# Patient Record
Sex: Female | Born: 1975 | Race: White | Hispanic: No | Marital: Married | Smoking: Never smoker
Health system: Southern US, Community
[De-identification: ages and names within clinical notes are randomized; demographics above are authoritative.]

## PROBLEM LIST (undated history)

## (undated) DIAGNOSIS — M359 Systemic involvement of connective tissue, unspecified: Secondary | ICD-10-CM

## (undated) DIAGNOSIS — IMO0002 Reserved for concepts with insufficient information to code with codable children: Secondary | ICD-10-CM

## (undated) HISTORY — DX: Reserved for concepts with insufficient information to code with codable children: IMO0002

## (undated) HISTORY — PX: BREAST BIOPSY: SHX20

## (undated) HISTORY — DX: Systemic involvement of connective tissue, unspecified: M35.9

## (undated) HISTORY — PX: WISDOM TOOTH EXTRACTION: SHX21

## (undated) HISTORY — PX: TONSILLECTOMY: SUR1361

---

## 2005-05-01 ENCOUNTER — Ambulatory Visit: Admission: RE | Admit: 2005-05-01 | Discharge: 2005-05-01 | Payer: Self-pay | Admitting: Obstetrics and Gynecology

## 2009-04-28 ENCOUNTER — Inpatient Hospital Stay (HOSPITAL_COMMUNITY): Admission: AD | Admit: 2009-04-28 | Discharge: 2009-04-29 | Payer: Self-pay | Admitting: Obstetrics and Gynecology

## 2010-11-22 LAB — CBC
HCT: 33.4 % — ABNORMAL LOW (ref 36.0–46.0)
MCV: 98.7 fL (ref 78.0–100.0)
Platelets: 214 10*3/uL (ref 150–400)
RDW: 12.4 % (ref 11.5–15.5)

## 2012-02-10 ENCOUNTER — Encounter: Payer: Self-pay | Admitting: Obstetrics and Gynecology

## 2012-02-16 ENCOUNTER — Ambulatory Visit: Payer: Self-pay | Admitting: Obstetrics and Gynecology

## 2012-04-28 ENCOUNTER — Ambulatory Visit: Payer: Self-pay | Admitting: Obstetrics and Gynecology

## 2012-05-07 ENCOUNTER — Encounter: Payer: Self-pay | Admitting: Obstetrics and Gynecology

## 2012-05-07 ENCOUNTER — Ambulatory Visit (INDEPENDENT_AMBULATORY_CARE_PROVIDER_SITE_OTHER): Payer: BC Managed Care – PPO | Admitting: Obstetrics and Gynecology

## 2012-05-07 VITALS — Ht 67.0 in | Wt 158.0 lb

## 2012-05-07 DIAGNOSIS — Z124 Encounter for screening for malignant neoplasm of cervix: Secondary | ICD-10-CM

## 2012-05-07 DIAGNOSIS — Z8342 Family history of familial hypercholesterolemia: Secondary | ICD-10-CM

## 2012-05-07 DIAGNOSIS — R5383 Other fatigue: Secondary | ICD-10-CM

## 2012-05-07 DIAGNOSIS — R5381 Other malaise: Secondary | ICD-10-CM

## 2012-05-07 DIAGNOSIS — Z8349 Family history of other endocrine, nutritional and metabolic diseases: Secondary | ICD-10-CM

## 2012-05-07 NOTE — Progress Notes (Signed)
Subjective:    Miranda Meyer is a 36 y.o. female, G3P3003, who presents for an annual exam.   Patient reports:  Doing well, no issues.  Husband plans vasectomy "sometime soon".  Patient on cycle today.   Children doing well.    History   Social History  . Marital Status: Married    Spouse Name: N/A    Number of Children: N/A  . Years of Education: N/A   Social History Main Topics  . Smoking status: Never Smoker   . Smokeless tobacco: Never Used  . Alcohol Use: No  . Drug Use: No  . Sexually Active: Yes    Birth Control/ Protection: Condom   Other Topics Concern  . None   Social History Narrative  . None    Menstrual cycle:   LMP: Patient's last menstrual period was 05/03/2012.           Cycle: WNL  The following portions of the patient's history were reviewed and updated as appropriate: allergies, current medications, past family history, past medical history, past social history, past surgical history and problem list.  Review of Systems Pertinent items are noted in HPI. Breast:Negative for breast lump,nipple discharge or nipple retraction Gastrointestinal: Negative for abdominal pain, change in bowel habits or rectal bleeding Urinary:negative   Objective:    Ht 5\' 7"  (1.702 m)  Wt 158 lb (71.668 kg)  BMI 24.75 kg/m2  LMP 05/03/2012    Weight:  Wt Readings from Last 1 Encounters:  05/07/12 158 lb (71.668 kg)          BMI: Body mass index is 24.75 kg/(m^2).  General Appearance: Alert, appropriate appearance for age. No acute distress HEENT: Grossly normal Neck / Thyroid: Supple, no masses, nodes or enlargement Lungs: clear to auscultation bilaterally Back: No CVA tenderness Breast Exam: No dimpling, nipple retraction or discharge. No masses or nodes. and No masses or nodes.No dimpling, nipple retraction or discharge. Cardiovascular: Regular rate and rhythm. S1, S2, no murmur Gastrointestinal: Soft, non-tender, no masses or organomegaly Pelvic Exam: Vulva and  vagina appear normal. Bimanual exam reveals normal uterus and adnexa. Rectovaginal: normal rectal, no masses Lymphatic Exam: Non-palpable nodes in neck, clavicular, axillary, or inguinal regions Skin: no rash or abnormalities Neurologic: Normal gait and speech, no tremor  Psychiatric: Alert and oriented, appropriate affect.   Wet Prep: NA Urinalysis:not applicable UPT: Not done   Assessment:    Normal gyn exam    Plan:    Mammogram: Age 53 Pap:  Done today STD screening: declined Contraception:condoms Requests fasting lipid profile due to family hx hypercholesterinemia--will add vit D due to some fatigue.  Vit D and fasting lipids as lab only at patient convenience.   Nyra Capes, [place]

## 2012-05-07 NOTE — Progress Notes (Signed)
The patient reports: no complaints  Contraception:condoms  Last mammogram: not applicable  Last pap: approximate date 2012 and was normal  GC/Chlamydia cultures offered: declined HIV/RPR/HbsAg offered:  declined HSV 1 and 2 glycoprotein offered: declined  Menstrual cycle regular and monthly: Yes Menstrual flow normal: Yes  Urinary symptoms: none Normal bowel movements: Yes Reports abuse at home: No:

## 2012-05-10 LAB — PAP IG W/ RFLX HPV ASCU

## 2012-05-21 ENCOUNTER — Other Ambulatory Visit: Payer: BC Managed Care – PPO

## 2012-05-21 DIAGNOSIS — Z8342 Family history of familial hypercholesterolemia: Secondary | ICD-10-CM

## 2012-05-21 DIAGNOSIS — R5383 Other fatigue: Secondary | ICD-10-CM

## 2012-05-21 LAB — LIPID PANEL
Total CHOL/HDL Ratio: 2 Ratio
VLDL: 6 mg/dL (ref 0–40)

## 2012-05-22 LAB — VITAMIN D 25 HYDROXY (VIT D DEFICIENCY, FRACTURES): Vit D, 25-Hydroxy: 37 ng/mL (ref 30–89)

## 2012-05-25 ENCOUNTER — Telehealth: Payer: Self-pay

## 2012-05-25 NOTE — Telephone Encounter (Signed)
Message copied by Larwance Rote on Tue May 25, 2012 11:09 AM ------      Message from: Cornelius Moras      Created: Sat May 22, 2012  8:12 AM       Please call patient and review normal labs.      Thanks!~      VL

## 2012-05-25 NOTE — Telephone Encounter (Signed)
LM for pt that lab results were normal.  Pt to call if questions.  ld

## 2014-06-19 ENCOUNTER — Encounter: Payer: Self-pay | Admitting: Obstetrics and Gynecology

## 2017-05-20 ENCOUNTER — Telehealth: Payer: Self-pay | Admitting: Vascular Surgery

## 2017-05-20 NOTE — Telephone Encounter (Signed)
-----   Message from Micah Flesher, RN sent at 05/18/2017  5:34 PM EDT ----- Regarding: scheduling Please schedule new VV evaluation and bilateral venous reflux study (unless seeing JDL).  Miranda Meyer has bilateral leg pain "heavy feeling" and VV R>L which have become progressively worse after 3 pregnancies.

## 2017-05-20 NOTE — Telephone Encounter (Signed)
Called pt and LVM for her regarding scheduling a VV new pt evaluation. I did mention that I did schedule her for the first available appt which is 06/22/17 at 1pm w/ JDL. AWT

## 2017-06-22 ENCOUNTER — Encounter: Payer: Self-pay | Admitting: Vascular Surgery

## 2017-06-22 ENCOUNTER — Ambulatory Visit (INDEPENDENT_AMBULATORY_CARE_PROVIDER_SITE_OTHER): Payer: BLUE CROSS/BLUE SHIELD | Admitting: Vascular Surgery

## 2017-06-22 VITALS — BP 110/71 | HR 81 | Temp 98.1°F | Resp 16 | Ht 67.0 in | Wt 150.0 lb

## 2017-06-22 DIAGNOSIS — I83893 Varicose veins of bilateral lower extremities with other complications: Secondary | ICD-10-CM | POA: Diagnosis not present

## 2017-06-22 DIAGNOSIS — I868 Varicose veins of other specified sites: Secondary | ICD-10-CM

## 2017-06-22 NOTE — Progress Notes (Signed)
Subjective:     Patient ID: Miranda RocherStacy Meyer, female   DOB: 08-Nov-1975, 41 y.o.   MRN: [id number]  HPI This 41 year old female was referred for evaluation of bilateral painful varicose veins. This patient developed varicose veins in the right leg after her first pregnancy. She has had 3 pregnancies the most recent of which was 8 years ago and since then varicosities have worsened significantly in the right leg and also in the left. She has aching throbbing burning discomfort in the calf and thigh area which worsens as the day progresses and does develop some swelling particularly on the right side. She has no history of DVT thrombophlebitis stasis ulcers or bleeding. She does not elastic compression stockings. Her symptoms continue to worsen and she does desire treatment.  Past Medical History:  Diagnosis Date  . Connective tissue disorder (HCC)   . Perineal laceration    poor healing     Social History   Tobacco Use  . Smoking status: Never Smoker  . Smokeless tobacco: Never Used  Substance Use Topics  . Alcohol use: No    Family History  Problem Relation Age of Onset  . Cancer Father        throat cancer   . Mental retardation Sister   . Hypertension Brother     No Known Allergies  No current outpatient medications on file.  Vitals:   06/22/17 1311  BP: 110/71  Pulse: 81  Resp: 16  Temp: 98.1 F (36.7 C)  SpO2: 99%  Weight: 150 lb (68 kg)  Height: 5\' 7"  (1.702 m)    Body mass index is 23.49 kg/m.           Review of Systems Denies chest pain, dyspnea on exertion, PND, orthopnea. Very healthy middle-aged female    Objective:   Physical Exam BP 110/71 (BP Location: Left Arm, Patient Position: Sitting, Cuff Size: Normal)   Pulse 81   Temp 98.1 F (36.7 C)   Resp 16   Ht 5\' 7"  (1.702 m)   Wt 150 lb (68 kg)   SpO2 99%   BMI 23.49 kg/m     Gen.-alert and oriented x3 in no apparent distress HEENT normal for age Lungs no rhonchi or  wheezing Cardiovascular regular rhythm no murmurs carotid pulses 3+ palpable no bruits audible Abdomen soft nontender no palpable masses Musculoskeletal free of  major deformities Skin clear -no rashes Neurologic normal Lower extremities 3+ femoral and dorsalis pedis pulses palpable bilaterally with no edema on the left 1+ edema on the right Extensive bulging varicosities beginning in the distal file the right extending below the knee and across the pretibial region down to the dorsum of the foot Left leg has bulging varicosities in the distal medial thigh medial calf posteriorly No hyperpigmentation or ulceration noted bilaterally  Today I performed bilateral ultrasound sono site exam and visualize both great saphenous veins. Each great saphenous vein is very large in caliber right greater than left with obvious gross reflux throughout supplying these painful varicosities       Assessment:     #1 painful varicosities bilaterally due to gross reflux bilateral great saphenous veins causing symptoms which are affecting patient's daily living-CEAP3    Plan:         #1 long leg elastic compression stockings 20-30 mm gradient #2 elevate legs as much as possible #3 ibuprofen daily on a regular basis for pain #4 return in 3 months-she will have formal venous reflux exam bilaterally upon return  in all make formal recommendation Its likely that she will need laser ablation right great saphenous vein plus greater than 20 stab phlebectomy followed by #2 laser ablation left great saphenous vein +10-20 stab phlebectomy of painful varicosities Return in 3 months

## 2017-06-23 NOTE — Addendum Note (Signed)
Addended by: Burton ApleyPETTY, Almas Rake A on: 06/23/2017 10:07 AM   Modules accepted: Orders

## 2017-09-22 ENCOUNTER — Encounter (HOSPITAL_COMMUNITY): Payer: BLUE CROSS/BLUE SHIELD

## 2017-09-22 ENCOUNTER — Ambulatory Visit: Payer: BLUE CROSS/BLUE SHIELD | Admitting: Vascular Surgery

## 2018-08-04 ENCOUNTER — Other Ambulatory Visit: Payer: Self-pay | Admitting: Obstetrics and Gynecology

## 2018-08-04 DIAGNOSIS — R921 Mammographic calcification found on diagnostic imaging of breast: Secondary | ICD-10-CM

## 2018-08-04 DIAGNOSIS — N6320 Unspecified lump in the left breast, unspecified quadrant: Secondary | ICD-10-CM

## 2018-08-10 ENCOUNTER — Other Ambulatory Visit: Payer: Self-pay | Admitting: Obstetrics and Gynecology

## 2018-08-10 ENCOUNTER — Ambulatory Visit
Admission: RE | Admit: 2018-08-10 | Discharge: 2018-08-10 | Disposition: A | Discharge status: Home / Self Care | Payer: BLUE CROSS/BLUE SHIELD | Source: Ambulatory Visit | Attending: Obstetrics and Gynecology | Admitting: Obstetrics and Gynecology

## 2018-08-10 ENCOUNTER — Ambulatory Visit
Admission: RE | Admit: 2018-08-10 | Discharge: 2018-08-10 | Disposition: A | Discharge status: Home / Self Care | Payer: 59 | Source: Ambulatory Visit | Attending: Obstetrics and Gynecology | Admitting: Obstetrics and Gynecology

## 2018-08-10 DIAGNOSIS — N6320 Unspecified lump in the left breast, unspecified quadrant: Secondary | ICD-10-CM

## 2018-08-10 DIAGNOSIS — R921 Mammographic calcification found on diagnostic imaging of breast: Secondary | ICD-10-CM

## 2018-08-10 DIAGNOSIS — N6310 Unspecified lump in the right breast, unspecified quadrant: Secondary | ICD-10-CM

## 2018-08-23 ENCOUNTER — Ambulatory Visit
Admission: RE | Admit: 2018-08-23 | Discharge: 2018-08-23 | Disposition: A | Discharge status: Home / Self Care | Payer: 59 | Source: Ambulatory Visit | Attending: Obstetrics and Gynecology | Admitting: Obstetrics and Gynecology

## 2018-08-23 DIAGNOSIS — N6310 Unspecified lump in the right breast, unspecified quadrant: Secondary | ICD-10-CM

## 2018-11-12 ENCOUNTER — Emergency Department (HOSPITAL_COMMUNITY): Payer: 59

## 2018-11-12 ENCOUNTER — Other Ambulatory Visit: Payer: Self-pay

## 2018-11-12 ENCOUNTER — Emergency Department (HOSPITAL_COMMUNITY)
Admission: EM | Admit: 2018-11-12 | Discharge: 2018-11-12 | Disposition: A | Discharge status: Home / Self Care | Payer: 59 | Attending: Emergency Medicine | Admitting: Emergency Medicine

## 2018-11-12 ENCOUNTER — Encounter (HOSPITAL_COMMUNITY): Payer: Self-pay

## 2018-11-12 DIAGNOSIS — Y998 Other external cause status: Secondary | ICD-10-CM | POA: Insufficient documentation

## 2018-11-12 DIAGNOSIS — S0990XA Unspecified injury of head, initial encounter: Secondary | ICD-10-CM | POA: Insufficient documentation

## 2018-11-12 DIAGNOSIS — Y9355 Activity, bike riding: Secondary | ICD-10-CM | POA: Diagnosis not present

## 2018-11-12 DIAGNOSIS — S20211A Contusion of right front wall of thorax, initial encounter: Secondary | ICD-10-CM | POA: Diagnosis not present

## 2018-11-12 DIAGNOSIS — M542 Cervicalgia: Secondary | ICD-10-CM | POA: Diagnosis not present

## 2018-11-12 DIAGNOSIS — Y929 Unspecified place or not applicable: Secondary | ICD-10-CM | POA: Diagnosis not present

## 2018-11-12 DIAGNOSIS — Z79899 Other long term (current) drug therapy: Secondary | ICD-10-CM | POA: Diagnosis not present

## 2018-11-12 MED ORDER — HYDROMORPHONE HCL 1 MG/ML IJ SOLN
0.5000 mg | Freq: Once | INTRAMUSCULAR | Status: DC
  Filled 2018-11-12: qty 1

## 2018-11-12 MED ORDER — ACETAMINOPHEN 500 MG PO TABS
1000.0000 mg | ORAL_TABLET | Freq: Once | ORAL | Status: AC
  Administered 2018-11-12: 1000 mg via ORAL
  Filled 2018-11-12: qty 2

## 2018-11-12 NOTE — ED Notes (Signed)
Patient transported to CT 

## 2018-11-12 NOTE — Discharge Instructions (Addendum)
Xrays of head, neck, chest showed no acute findings.  Will be sore for several days.  Tylenol and/or ibuprofen for pain.
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
## 2019-04-13 ENCOUNTER — Telehealth (HOSPITAL_COMMUNITY): Payer: Self-pay | Admitting: Rehabilitation

## 2019-04-13 NOTE — Telephone Encounter (Signed)

## 2019-04-14 ENCOUNTER — Other Ambulatory Visit: Payer: Self-pay

## 2019-04-14 ENCOUNTER — Ambulatory Visit (INDEPENDENT_AMBULATORY_CARE_PROVIDER_SITE_OTHER): Payer: 59 | Admitting: Vascular Surgery

## 2019-04-14 ENCOUNTER — Encounter: Payer: Self-pay | Admitting: Vascular Surgery

## 2019-04-14 VITALS — BP 109/75 | HR 71 | Temp 98.4°F | Resp 16 | Ht 67.0 in | Wt 154.0 lb

## 2019-04-14 DIAGNOSIS — I872 Venous insufficiency (chronic) (peripheral): Secondary | ICD-10-CM | POA: Diagnosis not present

## 2019-04-14 DIAGNOSIS — I83813 Varicose veins of bilateral lower extremities with pain: Secondary | ICD-10-CM

## 2019-04-14 NOTE — Progress Notes (Signed)
REASON FOR CONSULT:    Painful varicose veins bilaterally.    ASSESSMENT & PLAN:   PAINFUL VARICOSE VEINS BILATERALLY: This patient has painful varicose veins bilaterally.  She is failed conservative treatment.  I would like to obtain a formal venous reflux test bilaterally to confirm my findings on SonoSite.  However she has significant reflux throughout both great saphenous vein with dilated varicose veins as documented below.  She is failed conservative treatment including thigh-high compression stockings, leg elevation, and ibuprofen as needed for pain.  I think she would be a good candidate for endovenous laser ablation of the right great saphenous vein with 10-20 stab phlebectomies.  She could then subsequently have staged left laser ablation of the great saphenous vein with stab phlebectomies (10-20).  I have discussed the indications for endovenous laser ablation of the R & L GSV, that is to lower the pressure in the veins and potentially help relieve the symptoms from venous hypertension. I have also discussed alternative options including conservative treatment with leg elevation, compression therapy, exercise, avoiding prolonged sitting and standing, and weight management. I have discussed the potential complications of the procedure, including, but not limited to: bleeding, bruising, leg swelling, nerve injury, skin burns, significant pain from phlebitis, deep venous thrombosis, or failure of the vein to close.  I have also explained that venous insufficiency is a chronic disease, and that the patient is at risk for recurrent varicose veins in the future.  All of the patient's questions were encouraged and answered. They are agreeable to proceed.   I have discussed with the patient the indications for stab phlebectomy.  I have explained to the patient that that will have small scars from the stab incisions.  I explained that the other risks include leg swelling, bruising, bleeding, and  phlebitis.  All the patient's questions were encouraged and answered and they are agreeable to proceed.  We have also discussed the nature of venous disease including that this tends to be gradually progressive.  I encouraged her to continue to elevate her legs daily.  I have encouraged her to continue to wear her compression stockings.  We discussed the importance of avoiding prolonged sitting and standing.  We have discussed the importance of exercise specifically walking and water aerobics.  I will see her back after her formal reflux test and then hopefully we can get her scheduled for laser ablation and stab phlebectomies as discussed above.    Miranda Ferrari, MD, FACS Beeper [phone] Office: [phone]   HPI:   Miranda Meyer is a pleasant 43 y.o. female, who presents for follow-up of her painful varicose veins bilaterally.  This patient was evaluated by Dr. Hart Rochester about a year and a half ago.  She had developed varicose veins in her right leg after her first pregnancy.  She began having problems with varicose veins after that.  On exam she had bulging varicose veins of both lower extremities.  Dr. Hart Rochester looked at her veins with the SonoSite and she appeared to have significant reflux in both saphenous veins were large.  The right side was larger than the left.  She was placed in thigh-high compression stockings with a gradient of 20 to 30 mmHg.  She was encouraged to elevate her legs and take ibuprofen as needed for pain.  Since she was seen last she continues to have aching pain and heaviness in both lower extremities which is aggravated by sitting and standing and relieved with elevation.  Her symptoms are  also especially aggravated by intense exercise.  She has been wearing her thigh-high compression stockings which do help her symptoms.  She is also been elevating her legs and taking ibuprofen as needed for pain.  However she is continuing to have significant symptoms.  She has she has  had no previous history of DVT or phlebitis.  Past Medical History:  Diagnosis Date  . Connective tissue disorder ([facility])   . Perineal laceration    poor healing     Family History  Problem Relation Age of Onset  . Cancer Father        throat cancer   . Mental retardation Sister   . Hypertension Brother     SOCIAL HISTORY: Social History   Socioeconomic History  . Marital status: Married    Spouse name: Not on file  . Number of children: Not on file  . Years of education: Not on file  . Highest education level: Not on file  Occupational History  . Not on file  Social Needs  . Financial resource strain: Not on file  . Food insecurity    Worry: Not on file    Inability: Not on file  . Transportation needs    Medical: Not on file    Non-medical: Not on file  Tobacco Use  . Smoking status: Never Smoker  . Smokeless tobacco: Never Used  Substance and Sexual Activity  . Alcohol use: Yes    Comment: "a glass of wine a night"  . Drug use: No  . Sexual activity: Yes    Birth control/protection: Condom  Lifestyle  . Physical activity    Days per week: Not on file    Minutes per session: Not on file  . Stress: Not on file  Relationships  . Social Herbalist on phone: Not on file    Gets together: Not on file    Attends religious service: Not on file    Active member of club or organization: Not on file    Attends meetings of clubs or organizations: Not on file    Relationship status: Not on file  . Intimate partner violence    Fear of current or ex partner: Not on file    Emotionally abused: Not on file    Physically abused: Not on file    Forced sexual activity: Not on file  Other Topics Concern  . Not on file  Social History Narrative  . Not on file    No Known Allergies  Current Outpatient Medications  Medication Sig Dispense Refill  . fluticasone (FLONASE) 50 MCG/ACT nasal spray Place 1 spray into both nostrils daily.    . montelukast  (SINGULAIR) 10 MG tablet Take 10 mg by mouth at bedtime.     No current facility-administered medications for this visit.     REVIEW OF SYSTEMS:  [X]  denotes positive finding, [ ]  denotes negative finding Cardiac  Comments:  Chest pain or chest pressure:    Shortness of breath upon exertion:    Short of breath when lying flat:    Irregular heart rhythm:        Vascular    Pain in calf, thigh, or hip brought on by ambulation:    Pain in feet at night that wakes you up from your sleep:     Blood clot in your veins:    Leg swelling:         Pulmonary    Oxygen at home:  Productive cough:     Wheezing:         Neurologic    Sudden weakness in arms or legs:     Sudden numbness in arms or legs:     Sudden onset of difficulty speaking or slurred speech:    Temporary loss of vision in one eye:     Problems with dizziness:         Gastrointestinal    Blood in stool:     Vomited blood:         Genitourinary    Burning when urinating:     Blood in urine:        Psychiatric    Major depression:         Hematologic    Bleeding problems:    Problems with blood clotting too easily:        Skin    Rashes or ulcers:        Constitutional    Fever or chills:     PHYSICAL EXAM:   Vitals:   04/14/19 1326  BP: 109/75  Pulse: 71  Resp: 16  Temp: 98.4 F (36.9 C)  TempSrc: Temporal  SpO2: 98%  Weight: 154 lb (69.9 kg)  Height: 5\' 7"  (1.702 m)    GENERAL: The patient is a well-nourished female, in no acute distress. The vital signs are documented above. CARDIAC: There is a regular rate and rhythm.  VASCULAR: I do not detect carotid bruits. She has palpable pedal pulses bilaterally. VENOUS EXAM: The patient has significantly dilated varicose veins in both legs as documented below.        I looked at both saphenous veins myself with the SonoSite and she has gross reflux throughout and the entire thigh and the vein is significantly dilated. PULMONARY: There is  good air exchange bilaterally without wheezing or rales. ABDOMEN: Soft and non-tender with normal pitched bowel sounds.  MUSCULOSKELETAL: There are no major deformities or cyanosis. NEUROLOGIC: No focal weakness or paresthesias are detected. SKIN: There are no ulcers or rashes noted. PSYCHIATRIC: The patient has a normal affect.  DATA:    No new data  A total of 45 minutes was spent on this visit. 25 minutes was face to face time. More than 50% of the time was spent on counseling and coordinating with the patient.

## 2019-04-18 ENCOUNTER — Other Ambulatory Visit: Payer: Self-pay

## 2019-04-18 DIAGNOSIS — I83813 Varicose veins of bilateral lower extremities with pain: Secondary | ICD-10-CM

## 2019-04-20 ENCOUNTER — Telehealth (HOSPITAL_COMMUNITY): Payer: Self-pay | Admitting: Rehabilitation

## 2019-04-20 NOTE — Telephone Encounter (Signed)

## 2019-04-21 ENCOUNTER — Encounter: Payer: Self-pay | Admitting: Vascular Surgery

## 2019-04-21 ENCOUNTER — Other Ambulatory Visit: Payer: Self-pay

## 2019-04-21 ENCOUNTER — Ambulatory Visit (INDEPENDENT_AMBULATORY_CARE_PROVIDER_SITE_OTHER): Payer: 59 | Admitting: Vascular Surgery

## 2019-04-21 ENCOUNTER — Ambulatory Visit (HOSPITAL_COMMUNITY)
Admission: RE | Admit: 2019-04-21 | Discharge: 2019-04-21 | Disposition: A | Discharge status: Home / Self Care | Payer: 59 | Source: Ambulatory Visit | Attending: Vascular Surgery | Admitting: Vascular Surgery

## 2019-04-21 ENCOUNTER — Telehealth: Payer: Self-pay | Admitting: *Deleted

## 2019-04-21 DIAGNOSIS — I83813 Varicose veins of bilateral lower extremities with pain: Secondary | ICD-10-CM | POA: Insufficient documentation

## 2019-04-21 NOTE — Progress Notes (Signed)
Patient name: Miranda RocherStacy Meyer DOB: [birth year] Sex: female    Referring Provider is Dois Davenportichter, Karen L, MD  PCP is Dois Davenportichter, Karen L, MD  REASON FOR VIRTUAL VISIT: Chronic venous insufficiency.  I connected with Miranda Meyer on 04/21/19 at 12:00 PM EDT by a video enabled telemedicine application and verified that I am speaking with the correct person using two identifiers. I discussed the limitations of evaluation and management by telemedicine and the availability of in person appointments. The patient expressed understanding and agreed to proceed.  Location: Patient: Car Provider: Office  HPI: Miranda Meyer is a 43 y.o. female who I just saw on 04/14/2019 with painful varicose veins bilaterally.  She had failed conservative treatment and I felt based on my exam of her great saphenous veins with the SonoSite that she might be a good candidate for laser ablation of the great saphenous veins and 10-20 stab phlebectomies bilaterally.  However she has not had a formal venous test.  She came in for a formal venous test today and unfortunately I was running behind so was unable to discuss these results with her in the office today.  I did contact her today to discuss these results over the phone.  Current Outpatient Medications  Medication Sig Dispense Refill   fluticasone (FLONASE) 50 MCG/ACT nasal spray Place 1 spray into both nostrils daily.     montelukast (SINGULAIR) 10 MG tablet Take 10 mg by mouth at bedtime.     No current facility-administered medications for this visit.    OBSERVATIONS/OBJECTIVE:  DATA:  VENOUS DUPLEX: I have independently interpreted her venous duplex scan today.  On the right side there is no evidence of DVT.  There is deep venous reflux involving the popliteal vein.  There is superficial venous reflux involving the right great saphenous vein from the saphenofemoral junction to the knee.  The vein is significantly dilated with diameters up to 1.33 cm.  There is some reflux  in the right small saphenous vein however the vein is not significantly dilated.  On the left side, there is no evidence of DVT.  There is some deep venous reflux involving the popliteal vein.  There is superficial venous reflux involving the left great saphenous vein with reflux down to the distal thigh.  The vein is significantly dilated with diameters up to 1.15 cm.  There is some reflux in the small saphenous vein but again the small saphenous vein is not significantly dilated.  MEDICAL ISSUES:  PAINFUL VARICOSE VEINS BILATERALLY: This patient has painful varicose veins bilaterally and is failed conservative treatment including leg elevation, compression therapy and ibuprofen as needed for pain.  Based on her venous duplex scan in my assessment I think she would be a good candidate for endovenous laser ablation of both great saphenous veins and 10-20 stab phlebectomies bilaterally.  This would be done in a staged fashion.  Her symptoms seem to be slightly worse on the right so we would likely start on the right side.  [name] will be in touch with her to try to arrange for these procedures.  FOLLOW UP INSTRUCTIONS:   I discussed the assessment and treatment plan with the patient. The patient was provided an opportunity to ask questions and all were answered. The patient agreed with the plan and demonstrated an understanding of the instructions. The patient was advised to call back or seek an in-person evaluation if the symptoms worsen or if the condition fails to improve as anticipated.  I provided 10  minutes of non-face-to-face time during this encounter.   Deitra Mayo [facility]

## 2019-04-21 NOTE — Telephone Encounter (Signed)
Virtual Visit Pre-Appointment Phone Call  Today, I spoke with Miranda Meyer and performed the following actions:  1. I explained that we are currently trying to limit exposure to the COVID-19 virus by seeing patients at home rather than in the office.  I explained that the visits are best done by video, but can be done by telephone.  I asked the patient if a virtual visit that the patient would like to try instead of coming into the office. Miranda Meyer agreed to proceed with the virtual visit scheduled with Deitra Mayo on 04/21/19.     2. I confirmed the BEST phone number to call the day of the visit and- I included this in appointment notes.  3. I asked if the patient had access to (through a family member/friend) a smartphone with video capability to be used for her visit?"  The patient said yes -         4. I confirmed consent by  a. sending through [place] or by email the [place] as written at the end of this message or  b. verbally as listed below. i. This visit is being performed in the setting of COVID-19. ii. All virtual visits are billed to your insurance company just like a normal visit would be.   iii. We'd like you to understand that the technology does not allow for your provider to perform an examination, and thus may limit your provider's ability to fully assess your condition.  iv. If your provider identifies any concerns that need to be evaluated in person, we will make arrangements to do so.   v. Finally, though the technology is pretty good, we cannot assure that it will always work on either your or our end, and in the setting of a video visit, we may have to convert it to a phone-only visit.  In either situation, we cannot ensure that we have a secure connection.   vi. Are you willing to proceed?"  STAFF: Did the patient verbally acknowledge consent to telehealth visit? Document YES/NO here: YES  2. I advised the patient to be  prepared - I asked that the patient, on the day of her visit, record any information possible with the equipment at her home, such as blood pressure, pulse, oxygen saturation, and your weight and write them all down. I asked the patient to have a pen and paper handy nearby the day of the visit as well.  3. If the patient was scheduled for a video visit, I informed the patient that the visit with the doctor would start with a text to the smartphone # given to [place] by the patient.         If the patient was scheduled for a telephone call, I informed the patient that the visit with the doctor would start with a call to the telephone # given to [place] by the patient.  4. I Informed patient they will receive a phone call 15 minutes prior to their appointment time from a [place] or nurse to review medications, allergies, etc. to prepare for the visit.    TELEPHONE CALL NOTE  Miranda Meyer has been deemed a candidate for a follow-up tele-health visit to limit community exposure during the Covid-19 pandemic. I spoke with the patient via phone to ensure availability of phone/video source, confirm preferred email & phone number, and discuss instructions and expectations.  I reminded Miranda Meyer to be prepared with any vital sign  and/or heart rhythm information that could potentially be obtained via home monitoring, at the time of her visit. I reminded Miranda RocherStacy Mosey to expect a phone call prior to her visit.  Miranda Meyer, Miranda Meyer M, NT 04/21/2019 11:48 AM     FULL LENGTH CONSENT FOR TELE-HEALTH VISIT   I hereby voluntarily request, consent and authorize [facility] and its employed or contracted physicians, physician assistants, nurse practitioners or other licensed health care professionals (the Practitioner), to provide me with telemedicine health care services (the "Services") as deemed necessary by the treating Practitioner. I acknowledge and consent to receive the Services by the Practitioner via telemedicine. I  understand that the telemedicine visit will involve communicating with the Practitioner through live audiovisual communication technology and the disclosure of certain medical information by electronic transmission. I acknowledge that I have been given the opportunity to request an in-person assessment or other available alternative prior to the telemedicine visit and am voluntarily participating in the telemedicine visit.  I understand that I have the right to withhold or withdraw my consent to the use of telemedicine in the course of my care at any time, without affecting my right to future care or treatment, and that the Practitioner or I may terminate the telemedicine visit at any time. I understand that I have the right to inspect all information obtained and/or recorded in the course of the telemedicine visit and may receive copies of available information for a reasonable fee.  I understand that some of the potential risks of receiving the Services via telemedicine include:  [place]. Delay or interruption in medical evaluation due to technological equipment failure or disruption; . Information transmitted may not be sufficient (e.g. poor resolution of images) to allow for appropriate medical decision making by the Practitioner; and/or  . In rare instances, security protocols could fail, causing a breach of personal health information.  Furthermore, I acknowledge that it is my responsibility to provide information about my medical history, conditions and care that is complete and accurate to the best of my ability. I acknowledge that Practitioner's advice, recommendations, and/or decision may be based on factors not within their control, such as incomplete or inaccurate data provided by me or distortions of diagnostic images or specimens that may result from electronic transmissions. I understand that the practice of medicine is not an exact science and that Practitioner makes no warranties or guarantees  regarding treatment outcomes. I acknowledge that I will receive a copy of this consent concurrently upon execution via email to the email address I last provided but may also request a printed copy by calling the office of [facility].    I understand that my insurance will be billed for this visit.   I have read or had this consent read to me. . I understand the contents of this consent, which adequately explains the benefits and risks of the Services being provided via telemedicine.  . I have been provided ample opportunity to ask questions regarding this consent and the Services and have had my questions answered to my satisfaction. . I give my informed consent for the services to be provided through the use of telemedicine in my medical care  By participating in this telemedicine visit I agree to the above.

## 2019-04-28 ENCOUNTER — Other Ambulatory Visit: Payer: Self-pay | Admitting: *Deleted

## 2019-04-28 DIAGNOSIS — I83813 Varicose veins of bilateral lower extremities with pain: Secondary | ICD-10-CM

## 2019-05-12 ENCOUNTER — Other Ambulatory Visit: Payer: Self-pay

## 2019-05-12 ENCOUNTER — Ambulatory Visit (INDEPENDENT_AMBULATORY_CARE_PROVIDER_SITE_OTHER): Payer: 59 | Admitting: Vascular Surgery

## 2019-05-12 ENCOUNTER — Encounter: Payer: Self-pay | Admitting: Vascular Surgery

## 2019-05-12 VITALS — BP 109/73 | HR 70 | Temp 98.4°F | Resp 16 | Ht 67.0 in | Wt 154.0 lb

## 2019-05-12 DIAGNOSIS — I83811 Varicose veins of right lower extremities with pain: Secondary | ICD-10-CM | POA: Diagnosis not present

## 2019-05-12 HISTORY — PX: ENDOVENOUS ABLATION SAPHENOUS VEIN W/ LASER: SUR449

## 2019-05-12 NOTE — Progress Notes (Signed)
     Laser Ablation Procedure    Date: 05/12/2019   Miranda Meyer DOB:07/05/1976  Consent signed: Yes    Surgeon: Gae Gallop MD   Procedure: Laser Ablation: right Greater Saphenous Vein  BP 109/73 (BP Location: Left Arm, Patient Position: Sitting, Cuff Size: Normal)   Pulse 70   Temp 98.4 F (36.9 C) (Temporal)   Resp 16   Ht 5\' 7"  (1.702 m)   Wt 154 lb (69.9 kg)   SpO2 100%   BMI 24.12 kg/m   Tumescent Anesthesia: 600 cc 0.9% NaCl with 50 cc Lidocaine HCL 1%  and 15 cc 8.4% NaHCO3  Local Anesthesia: 12 cc Lidocaine HCL and NaHCO3 (ratio 2:1)  15 watts continuous mode        Total energy: 1638 Joules   Total time: 1:48  Laser Fiber Ref. # [id number]      Lot # [id number]   Stab Phlebectomy: 10-20 Sites: Calf and Ankle  Patient tolerated procedure well  Notes: Patient wore face mask.  All staff members wore facial masks and facial shields/goggles.    Description of Procedure:  After marking the course of the secondary varicosities, the patient was placed on the operating table in the supine position, and the right leg was prepped and draped in sterile fashion.   Local anesthetic was administered and under ultrasound guidance the saphenous vein was accessed with a micro needle and guide wire; then the mirco puncture sheath was placed.  A guide wire was inserted saphenofemoral junction , followed by a 5 french sheath.  The position of the sheath and then the laser fiber below the junction was confirmed using the ultrasound.  Tumescent anesthesia was administered along the course of the saphenous vein using ultrasound guidance. The patient was placed in Trendelenburg position and protective laser glasses were placed on patient and staff, and the laser was fired at 15 watts continuous mode advancing 1-63mm/second for a total of 1638 joules.   For stab phlebectomies, local anesthetic was administered at the previously marked varicosities, and tumescent anesthesia was administered around  the vessels.  Ten to 20 stab wounds were made using the tip of an 11 blade. And using the vein hook, the phlebectomies were performed using a hemostat to avulse the varicosities.  Adequate hemostasis was achieved.     Steri strips were applied to the stab wounds and ABD pads and thigh high compression stockings were applied.  Ace wrap bandages were applied over the phlebectomy sites and at the top of the saphenofemoral junction. Blood loss was less than 15 cc.  Discharge instructions reviewed with patient and hardcopy of discharge instructions given to patient to take home. The patient ambulated out of the operating room having tolerated the procedure well.

## 2019-05-12 NOTE — Progress Notes (Signed)
109/73 

## 2019-05-12 NOTE — Progress Notes (Signed)
   Patient name: Miranda Meyer MRN: [id number] DOB: [birth year] Sex: female  REASON FOR VISIT:   For laser ablation right great saphenous vein and stab phlebectomies  HPI:   Miranda Meyer is a pleasant 43 y.o. female who presents for laser ablation and stab phlebectomies.  She had failed conservative treatment and was having persistent painful varicose veins of the right lower extremity  Current Outpatient Medications  Medication Sig Dispense Refill  . fluticasone (FLONASE) 50 MCG/ACT nasal spray Place 1 spray into both nostrils daily.    . montelukast (SINGULAIR) 10 MG tablet Take 10 mg by mouth at bedtime.     No current facility-administered medications for this visit.     REVIEW OF SYSTEMS:  [X]  denotes positive finding, [ ]  denotes negative finding Vascular    Leg swelling    Cardiac    Chest pain or chest pressure:    Shortness of breath upon exertion:    Short of breath when lying flat:    Irregular heart rhythm:    Constitutional    Fever or chills:     PHYSICAL EXAM:   Vitals:   05/12/19 1049  BP: 109/73  Pulse: 70  Resp: 16  Temp: 98.4 F (36.9 C)  TempSrc: Temporal  SpO2: 100%  Weight: 154 lb (69.9 kg)  Height: 5\' 7"  (1.702 m)    GENERAL: The patient is a well-nourished female, in no acute distress. The vital signs are documented above. DATA:   No new data  MEDICAL ISSUES:   LASER ABLATION AND STAB PHLEBECTOMIES: Patient was taken to the exam room and the large dilated varicose veins in the right leg were marked with the patient standing.  The patient was then placed supine and I interrogated the great saphenous vein with the SonoSite.  It looks like I could cannulated at the knee.  Right leg was prepped and draped in usual sterile fashion.  Under ultrasound guidance, after the skin was anesthetized, the great saphenous vein was cannulated to the level of the knee with a micropuncture needle and a micropuncture sheath introduced over the wire.  The J-wire was  advanced through the micropuncture sheath and positioned proximally 2-1/2 cm distal to the saphenofemoral junction.  The 45 cm sheath was advanced to below the saphenofemoral junction and the wire and dilator were removed.  The end of the sheath was positioned approximately 2 and half centimeters distal to the saphenofemoral junction.  The laser fiber was positioned at the end of the sheath and then the sheath retracted slightly.  Tumescent anesthesia was administered the entire length of the vein circumferentially.  The patient was placed in Trendelenburg.  Using later laser precautions great saphenous vein was lasered from 2-1/2 cm distal to the saphenofemoral junction to the level of the knee.  1638 J of energy were used.  Attention was then turned to the stab phlebectomies.  Tumescent anesthesia was administered all the marked areas.  Using small stab incisions with an 11 blade the vein was grasped and brought above the skin and then junk gently bluntly excised using hemostats.  Pressure was held for hemostasis.  Approximately 20 stabs were performed.  Sterile dressing was applied.  Patient tolerated the procedure well.  She will return in 1 week for follow-up duplex.  Deitra Mayo [facility] [phone]

## 2019-05-17 ENCOUNTER — Other Ambulatory Visit: Payer: Self-pay | Admitting: Obstetrics and Gynecology

## 2019-05-17 ENCOUNTER — Encounter: Payer: Self-pay | Admitting: Vascular Surgery

## 2019-05-17 DIAGNOSIS — N6320 Unspecified lump in the left breast, unspecified quadrant: Secondary | ICD-10-CM

## 2019-05-17 DIAGNOSIS — N6310 Unspecified lump in the right breast, unspecified quadrant: Secondary | ICD-10-CM

## 2019-05-17 DIAGNOSIS — N63 Unspecified lump in unspecified breast: Secondary | ICD-10-CM

## 2019-05-19 ENCOUNTER — Encounter: Payer: Self-pay | Admitting: Vascular Surgery

## 2019-05-19 ENCOUNTER — Other Ambulatory Visit: Payer: 59

## 2019-05-19 ENCOUNTER — Other Ambulatory Visit: Payer: Self-pay

## 2019-05-19 ENCOUNTER — Ambulatory Visit (INDEPENDENT_AMBULATORY_CARE_PROVIDER_SITE_OTHER): Payer: Self-pay | Admitting: Vascular Surgery

## 2019-05-19 ENCOUNTER — Ambulatory Visit (HOSPITAL_COMMUNITY)
Admission: RE | Admit: 2019-05-19 | Discharge: 2019-05-19 | Disposition: A | Discharge status: Home / Self Care | Payer: 59 | Source: Ambulatory Visit | Attending: Vascular Surgery | Admitting: Vascular Surgery

## 2019-05-19 VITALS — BP 99/67 | HR 62 | Temp 97.7°F | Resp 18 | Ht 67.0 in | Wt 154.0 lb

## 2019-05-19 DIAGNOSIS — I83813 Varicose veins of bilateral lower extremities with pain: Secondary | ICD-10-CM | POA: Insufficient documentation

## 2019-05-19 DIAGNOSIS — Z48812 Encounter for surgical aftercare following surgery on the circulatory system: Secondary | ICD-10-CM

## 2019-05-19 NOTE — Progress Notes (Signed)
   Patient name: Laquandra Carrillo MRN: [id number] DOB: May 19, 1976 Sex: female  REASON FOR VISIT:   Follow-up after laser ablation and stab phlebectomies  HPI:   Kemisha Bonnette is a pleasant 43 y.o. female who underwent laser ablation of the right great saphenous vein and 10-20 stab phlebectomies on 05/12/2019.  She comes in for a follow-up visit.  Overall she is doing well.  She is had some mild ecchymosis.  She has no other specific complaints.  We were considering staged ablation on the left although she states her symptoms have not been too bad lately.  She has been wearing her compression stockings and elevating her legs some.  Current Outpatient Medications  Medication Sig Dispense Refill  . fluticasone (FLONASE) 50 MCG/ACT nasal spray Place 1 spray into both nostrils daily.    . montelukast (SINGULAIR) 10 MG tablet Take 10 mg by mouth at bedtime.     No current facility-administered medications for this visit.     REVIEW OF SYSTEMS:  [X]  denotes positive finding, [ ]  denotes negative finding Vascular    Leg swelling    Cardiac    Chest pain or chest pressure:    Shortness of breath upon exertion:    Short of breath when lying flat:    Irregular heart rhythm:    Constitutional    Fever or chills:     PHYSICAL EXAM:   Vitals:   05/19/19 1030  BP: 99/67  Pulse: 62  Resp: 18  Temp: 97.7 F (36.5 C)  TempSrc: Temporal  SpO2: 100%  Weight: 154 lb (69.9 kg)  Height: 5\' 7"  (1.702 m)    GENERAL: The patient is a well-nourished female, in no acute distress. The vital signs are documented above. CARDIOVASCULAR: There is a regular rate and rhythm. PULMONARY: There is good air exchange bilaterally without wheezing or rales. VASCULAR: She has mild ecchymosis in the medial thigh..  Incisions are healing adequately.  She has no significant leg swelling.  DATA:   VENOUS DUPLEX: I have independently interpreted her venous duplex scan today.  There is no evidence of DVT.  The right  great saphenous vein is closed from 0.92 cm distal to the saphenofemoral junction to the proximal calf.  MEDICAL ISSUES:   S/P LASER ABLATION RIGHT GREAT SAPHENOUS VEIN AND STAB PHLEBECTOMIES: Patient is doing well status post laser ablation of the right great saphenous vein with 10-20 stab phlebectomies.  She will continue to wear her thigh-high compression stockings for another week.  If her symptoms worsen on the left side she will let us know and we can schedule her for staged laser ablation of the left great saphenous vein with 10-20 stab phlebectomies.   Deitra Mayo [facility] [phone]

## 2019-05-27 ENCOUNTER — Other Ambulatory Visit: Payer: Self-pay

## 2019-05-27 ENCOUNTER — Ambulatory Visit
Admission: RE | Admit: 2019-05-27 | Discharge: 2019-05-27 | Disposition: A | Discharge status: Home / Self Care | Payer: 59 | Source: Ambulatory Visit | Attending: Obstetrics and Gynecology | Admitting: Obstetrics and Gynecology

## 2019-05-27 ENCOUNTER — Other Ambulatory Visit: Payer: Self-pay | Admitting: Obstetrics and Gynecology

## 2019-05-27 DIAGNOSIS — N6310 Unspecified lump in the right breast, unspecified quadrant: Secondary | ICD-10-CM

## 2019-05-27 DIAGNOSIS — N6320 Unspecified lump in the left breast, unspecified quadrant: Secondary | ICD-10-CM

## 2019-11-28 ENCOUNTER — Other Ambulatory Visit: Payer: 59

## 2020-06-19 ENCOUNTER — Other Ambulatory Visit: Payer: Self-pay | Admitting: Obstetrics and Gynecology

## 2020-06-19 DIAGNOSIS — N6320 Unspecified lump in the left breast, unspecified quadrant: Secondary | ICD-10-CM

## 2020-07-18 ENCOUNTER — Ambulatory Visit
Admission: RE | Admit: 2020-07-18 | Discharge: 2020-07-18 | Disposition: A | Discharge status: Home / Self Care | Payer: 59 | Source: Ambulatory Visit | Attending: Obstetrics and Gynecology | Admitting: Obstetrics and Gynecology

## 2020-07-18 ENCOUNTER — Other Ambulatory Visit: Payer: Self-pay

## 2020-07-18 ENCOUNTER — Other Ambulatory Visit: Payer: Self-pay | Admitting: Obstetrics and Gynecology

## 2020-07-18 DIAGNOSIS — N6320 Unspecified lump in the left breast, unspecified quadrant: Secondary | ICD-10-CM

## 2021-04-03 ENCOUNTER — Other Ambulatory Visit: Payer: Self-pay | Admitting: Obstetrics and Gynecology

## 2021-04-03 DIAGNOSIS — Z09 Encounter for follow-up examination after completed treatment for conditions other than malignant neoplasm: Secondary | ICD-10-CM

## 2021-04-05 ENCOUNTER — Other Ambulatory Visit: Payer: Self-pay | Admitting: Obstetrics and Gynecology

## 2021-04-05 DIAGNOSIS — N63 Unspecified lump in unspecified breast: Secondary | ICD-10-CM

## 2021-05-03 ENCOUNTER — Other Ambulatory Visit: Payer: 59

## 2021-07-19 ENCOUNTER — Other Ambulatory Visit: Payer: 59

## 2021-07-31 ENCOUNTER — Ambulatory Visit
Admission: RE | Admit: 2021-07-31 | Discharge: 2021-07-31 | Disposition: A | Discharge status: Home / Self Care | Payer: 59 | Source: Ambulatory Visit | Attending: Obstetrics and Gynecology | Admitting: Obstetrics and Gynecology

## 2021-07-31 ENCOUNTER — Other Ambulatory Visit: Payer: Self-pay

## 2021-07-31 DIAGNOSIS — N63 Unspecified lump in unspecified breast: Secondary | ICD-10-CM

## 2022-09-24 ENCOUNTER — Other Ambulatory Visit: Payer: Self-pay | Admitting: Obstetrics and Gynecology

## 2022-09-24 DIAGNOSIS — Z1231 Encounter for screening mammogram for malignant neoplasm of breast: Secondary | ICD-10-CM

## 2022-10-09 ENCOUNTER — Ambulatory Visit
Admission: RE | Admit: 2022-10-09 | Discharge: 2022-10-09 | Disposition: A | Discharge status: Home / Self Care | Payer: No Typology Code available for payment source | Source: Ambulatory Visit | Attending: Obstetrics and Gynecology | Admitting: Obstetrics and Gynecology

## 2022-10-09 DIAGNOSIS — Z1231 Encounter for screening mammogram for malignant neoplasm of breast: Secondary | ICD-10-CM

## 2022-10-24 IMAGING — MG DIGITAL DIAGNOSTIC BILAT W/ TOMO W/ CAD
8 series · 8 of 24 positions shown · non-contrast
Comparison: Previous exam(s).

CLINICAL DATA: 2+ year follow-up of a probable benign cyst in 11
o'clock region of the left breast 3 cm from the nipple. The patient
has had 3 benign biopsies of the right breast.

EXAM:
DIGITAL DIAGNOSTIC BILATERAL MAMMOGRAM WITH TOMOSYNTHESIS AND CAD;
ULTRASOUND LEFT BREAST LIMITED
TECHNIQUE: Bilateral digital diagnostic mammography and breast tomosynthesis
was performed. The images were evaluated with computer-aided
detection.; Targeted ultrasound examination of the left breast was
performed.

[L CC synth-2D]
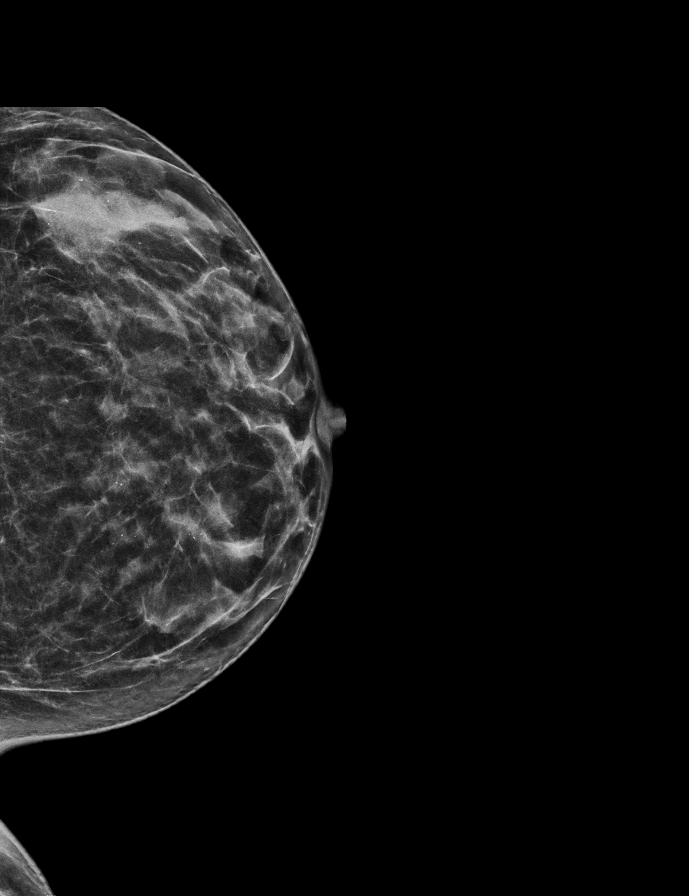

[R CC synth-2D]
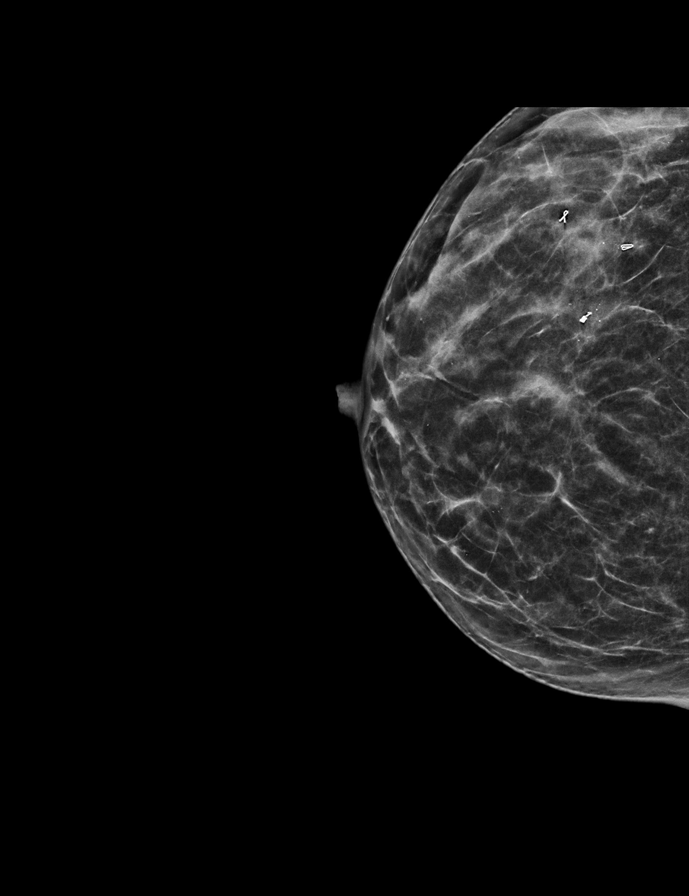

[R MLO synth-2D]
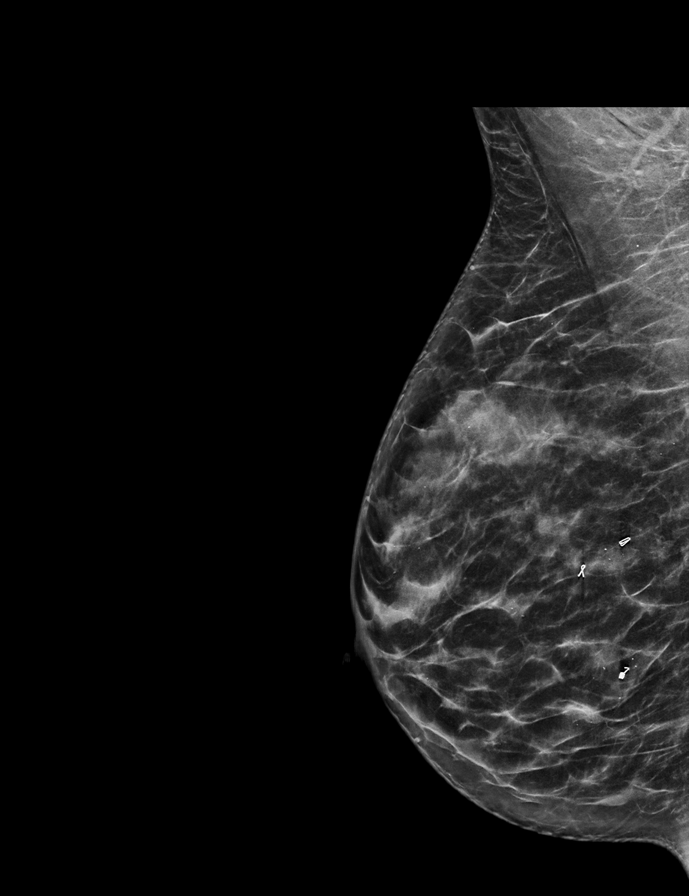

[L MLO synth-2D]
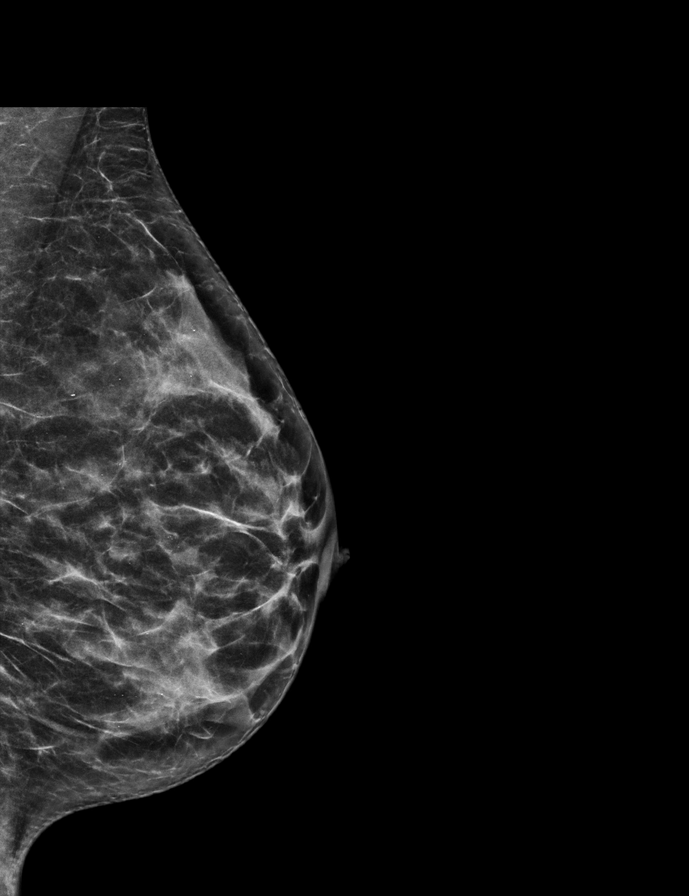

[R MLO tomo · tomo slice 29/58.0]
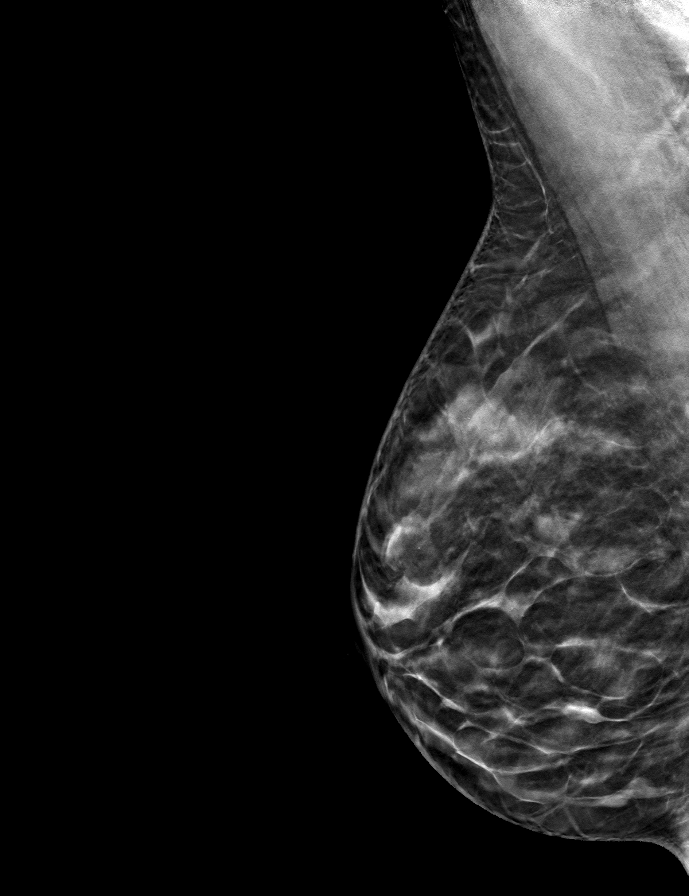

[L CC tomo · tomo slice 25/50.0]
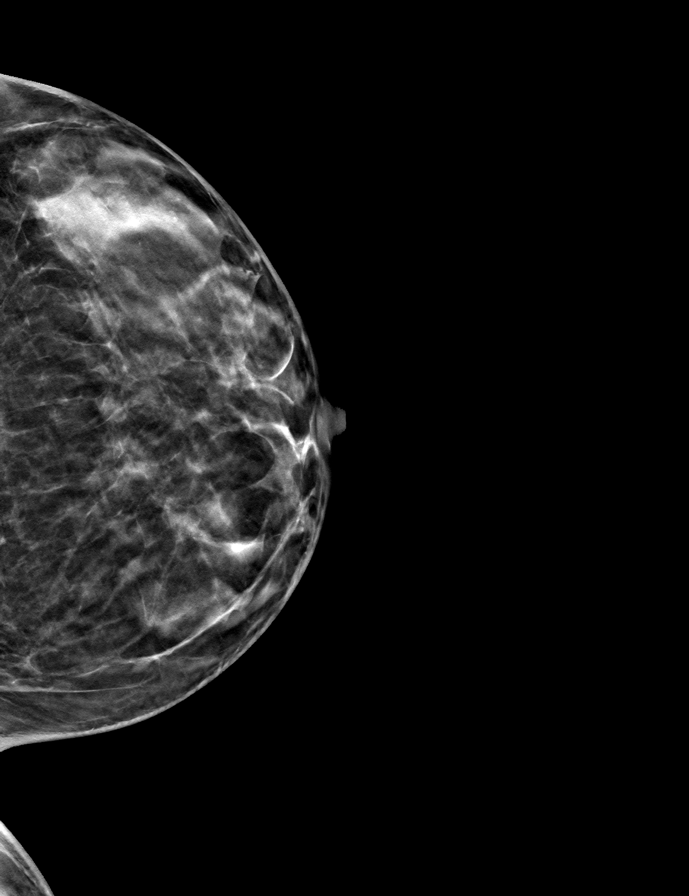

[R CC tomo · tomo slice 25/49.0]
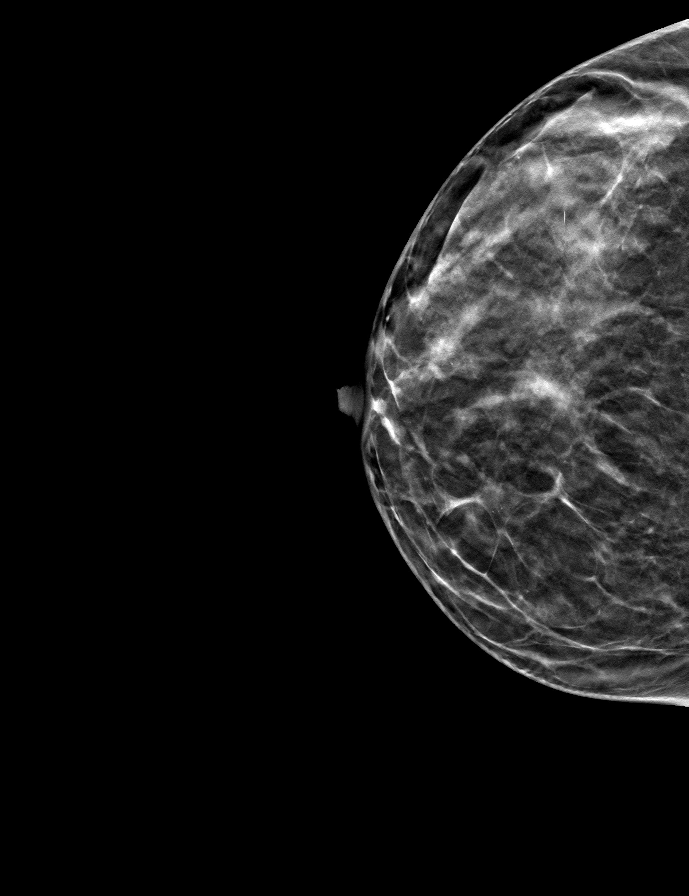

[L MLO tomo · tomo slice 27/52.0]
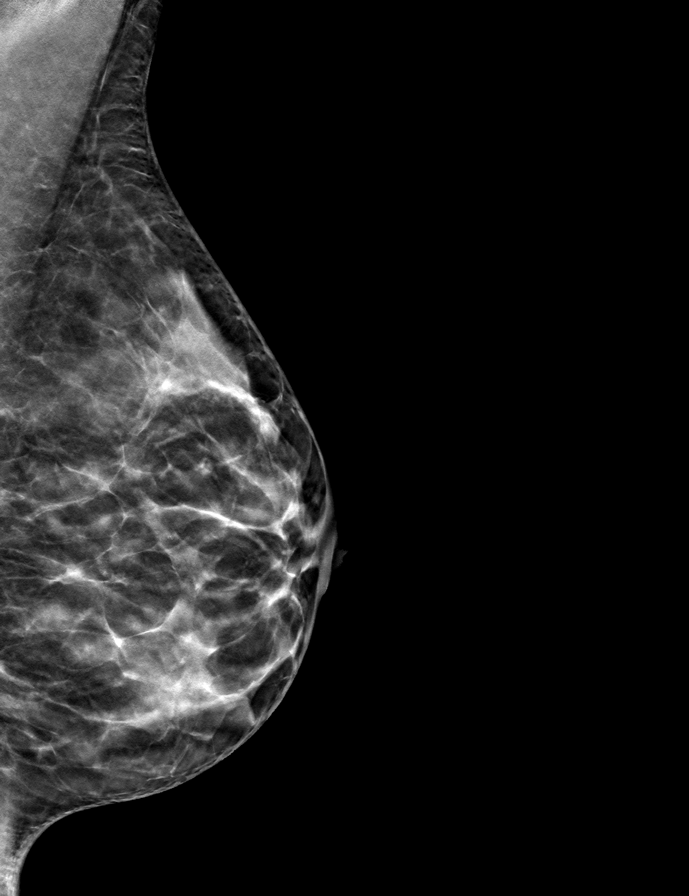

[8 of 24 positions shown; findings below may reference images not displayed]

ACR Breast Density Category c: The breast tissue is heterogeneously
dense, which may obscure small masses.
FINDINGS: No suspicious mass, malignant type microcalcifications or distortion
detected in either breast.

On physical exam, no mass is palpated in the left breast at 11
o'clock 3 cm from the nipple.

Targeted ultrasound is performed, showing normal tissue in the left
breast at 11 o'clock 3 cm from the nipple. The previously seen
probable benign cyst is no longer visualized.
IMPRESSION: No evidence of malignancy in either breast.

RECOMMENDATION:
Bilateral screening mammogram in 1 year is recommended.

I have discussed the findings and recommendations with the patient.
If applicable, a reminder letter will be sent to the patient
regarding the next appointment.

BI-RADS CATEGORY  1: Negative.

## 2023-06-23 ENCOUNTER — Other Ambulatory Visit: Payer: Self-pay | Admitting: Obstetrics and Gynecology

## 2023-06-23 DIAGNOSIS — Z1231 Encounter for screening mammogram for malignant neoplasm of breast: Secondary | ICD-10-CM

## 2023-09-10 DIAGNOSIS — R051 Acute cough: Secondary | ICD-10-CM | POA: Diagnosis not present

## 2023-09-10 DIAGNOSIS — J069 Acute upper respiratory infection, unspecified: Secondary | ICD-10-CM | POA: Diagnosis not present

## 2023-09-22 DIAGNOSIS — F411 Generalized anxiety disorder: Secondary | ICD-10-CM | POA: Diagnosis not present

## 2023-09-22 DIAGNOSIS — Z630 Problems in relationship with spouse or partner: Secondary | ICD-10-CM | POA: Diagnosis not present

## 2023-09-29 ENCOUNTER — Encounter (HOSPITAL_BASED_OUTPATIENT_CLINIC_OR_DEPARTMENT_OTHER): Payer: No Typology Code available for payment source | Admitting: Obstetrics & Gynecology

## 2023-10-06 ENCOUNTER — Other Ambulatory Visit (HOSPITAL_COMMUNITY)
Admission: RE | Admit: 2023-10-06 | Discharge: 2023-10-06 | Disposition: A | Discharge status: Home / Self Care | Payer: BC Managed Care – PPO | Source: Ambulatory Visit | Attending: Obstetrics & Gynecology | Admitting: Obstetrics & Gynecology

## 2023-10-06 ENCOUNTER — Ambulatory Visit (HOSPITAL_BASED_OUTPATIENT_CLINIC_OR_DEPARTMENT_OTHER): Payer: BC Managed Care – PPO | Admitting: Obstetrics & Gynecology

## 2023-10-06 ENCOUNTER — Encounter (HOSPITAL_BASED_OUTPATIENT_CLINIC_OR_DEPARTMENT_OTHER): Payer: Self-pay | Admitting: Obstetrics & Gynecology

## 2023-10-06 VITALS — BP 114/83 | HR 63 | Ht 67.75 in | Wt 151.8 lb

## 2023-10-06 DIAGNOSIS — B009 Herpesviral infection, unspecified: Secondary | ICD-10-CM | POA: Insufficient documentation

## 2023-10-06 DIAGNOSIS — Z124 Encounter for screening for malignant neoplasm of cervix: Secondary | ICD-10-CM | POA: Diagnosis not present

## 2023-10-06 DIAGNOSIS — Z8742 Personal history of other diseases of the female genital tract: Secondary | ICD-10-CM | POA: Diagnosis not present

## 2023-10-06 DIAGNOSIS — K644 Residual hemorrhoidal skin tags: Secondary | ICD-10-CM | POA: Diagnosis not present

## 2023-10-06 DIAGNOSIS — Z01419 Encounter for gynecological examination (general) (routine) without abnormal findings: Secondary | ICD-10-CM | POA: Diagnosis not present

## 2023-10-06 DIAGNOSIS — Z630 Problems in relationship with spouse or partner: Secondary | ICD-10-CM | POA: Diagnosis not present

## 2023-10-06 DIAGNOSIS — F411 Generalized anxiety disorder: Secondary | ICD-10-CM | POA: Diagnosis not present

## 2023-10-06 MED ORDER — HYDROCORTISONE (PERIANAL) 2.5 % EX CREA: TOPICAL_CREAM | Freq: Two times a day (BID) | CUTANEOUS | 1 refills | Status: AC

## 2023-10-06 NOTE — Progress Notes (Signed)
 ANNUAL EXAM Patient name: Miranda Meyer MRN [id number]  Date of birth: 01-21-1976 Chief Complaint:   New Patient (Initial Visit) (Annual Exam/)  History of Present Illness:   Miranda Meyer is a 48 y.o. G45P3003 Caucasian female being seen today for a routine annual exam/new patient appointment.  Cycles are regular but are longer and heavier over the past 18 - 24 months.  Flow starts out as dark for about 24 hours and then is heavier flow.  Uses ultra tampons.  She does pass some clots.  Reports the last cycle was lighter for her.  Overall, flow lasts 9-10 days all total.  Not really interested in hormonal therapy or invention.  Spouse has hx of vasectomy.    Has just moved in-laws from [place], [place], to an air B&B.  Life is feeling crazy at this moment.     Patient's last menstrual period was 09/22/2023 (approximate).  Last pap unsure.  H/O abnormal pap: no Last mammogram: 10/09/2022. Results were: normal. Family h/o breast cancer: no Last colonoscopy: 05/2023. Results were: normal. Family h/o colorectal cancer: no     10/06/2023    9:48 AM  Depression screen PHQ 2/9  Decreased Interest 0  Down, Depressed, Hopeless 0  PHQ - 2 Score 0         No data to display           Review of Systems:   Pertinent items are noted in HPI Denies any headaches, blurred vision, fatigue, shortness of breath, chest pain, abdominal pain, abnormal vaginal discharge/itching/odor/irritation, problems with periods, bowel movements, urination, or intercourse unless otherwise stated above. Pertinent History Reviewed:  Reviewed past medical,surgical, social and family history.  Reviewed problem list, medications and allergies. Physical Assessment:   Vitals:   10/06/23 0945  BP: 114/83  Pulse: 63  Weight: 151 lb 12.8 oz (68.9 kg)  Height: 5' 7.75" (1.721 m)  Body mass index is 23.25 kg/m.        Physical Examination:   General appearance - well appearing, and in no distress  Mental status -  alert, oriented to person, place, and time  Psych:  She has a normal mood and affect  Skin - warm and dry, normal color, no suspicious lesions noted  Chest - effort normal, all lung fields clear to auscultation bilaterally  Heart - normal rate and regular rhythm  Neck:  midline trachea, no thyromegaly or nodules  Breasts - breasts appear normal, no suspicious masses, no skin or nipple changes or  axillary nodes  Abdomen - soft, nontender, nondistended, no masses or organomegaly  Pelvic - VULVA: normal appearing vulva with no masses, tenderness or lesions   VAGINA: normal appearing vagina with normal color and discharge, no lesions   CERVIX: normal appearing cervix without discharge or lesions, no CMT  Thin prep pap is done with HR HPV cotesting  UTERUS: uterus is felt to be normal size, shape, consistency and nontender   ADNEXA: No adnexal masses or tenderness noted.  Rectal - normal rectal, good sphincter tone, external   Extremities:  No swelling or varicosities noted  Chaperone present for exam  Assessment & Plan:  1. Well woman exam with routine gynecological exam (Primary) - Pap smear obtained today - Mammogram 09/2022.  Pt is planning mammogram every 24 monhts. - Colonoscopy 2024.  Follow up 10 years. - Bone mineral density guidelines reviewed. - lab work done with PCP, Dr. Clelia Croft - vaccines reviewed/updated  2. Cervical cancer screening - Cytology -  PAP( [facility])  3. History of menorrhagia - bleeding is improved.  Will plan evaluation if she has another   4. External hemorrhoid - hydrocortisone (ANUSOL-HC) 2.5 % rectal cream; Place rectally 2 (two) times daily. Can use for 7 days when needed.  Dispense: 30 g; Refill: 1    Meds:  Meds ordered this encounter  Medications   hydrocortisone (ANUSOL-HC) 2.5 % rectal cream    Sig: Place rectally 2 (two) times daily. Can use for 7 days when needed.    Dispense:  30 g    Refill:  1    Follow-up: Return in about 1 year  (around 10/05/2024).  Jerene Bears, MD 10/06/2023 10:45 AM

## 2023-10-08 LAB — CYTOLOGY - PAP
Comment: NEGATIVE
Diagnosis: NEGATIVE
High risk HPV: NEGATIVE

## 2023-10-12 ENCOUNTER — Ambulatory Visit: Payer: No Typology Code available for payment source

## 2023-10-20 DIAGNOSIS — F411 Generalized anxiety disorder: Secondary | ICD-10-CM | POA: Diagnosis not present

## 2023-10-20 DIAGNOSIS — Z630 Problems in relationship with spouse or partner: Secondary | ICD-10-CM | POA: Diagnosis not present

## 2023-11-03 DIAGNOSIS — Z630 Problems in relationship with spouse or partner: Secondary | ICD-10-CM | POA: Diagnosis not present

## 2023-11-03 DIAGNOSIS — F411 Generalized anxiety disorder: Secondary | ICD-10-CM | POA: Diagnosis not present

## 2023-11-17 DIAGNOSIS — F411 Generalized anxiety disorder: Secondary | ICD-10-CM | POA: Diagnosis not present

## 2023-11-17 DIAGNOSIS — Z630 Problems in relationship with spouse or partner: Secondary | ICD-10-CM | POA: Diagnosis not present

## 2023-12-08 DIAGNOSIS — Z630 Problems in relationship with spouse or partner: Secondary | ICD-10-CM | POA: Diagnosis not present

## 2023-12-08 DIAGNOSIS — F411 Generalized anxiety disorder: Secondary | ICD-10-CM | POA: Diagnosis not present

## 2024-01-12 DIAGNOSIS — R7989 Other specified abnormal findings of blood chemistry: Secondary | ICD-10-CM | POA: Diagnosis not present

## 2024-01-19 DIAGNOSIS — M7062 Trochanteric bursitis, left hip: Secondary | ICD-10-CM | POA: Diagnosis not present

## 2024-01-19 DIAGNOSIS — Z1339 Encounter for screening examination for other mental health and behavioral disorders: Secondary | ICD-10-CM | POA: Diagnosis not present

## 2024-01-19 DIAGNOSIS — Z0000 Encounter for general adult medical examination without abnormal findings: Secondary | ICD-10-CM | POA: Diagnosis not present

## 2024-01-19 DIAGNOSIS — R82998 Other abnormal findings in urine: Secondary | ICD-10-CM | POA: Diagnosis not present

## 2024-01-19 DIAGNOSIS — Z1331 Encounter for screening for depression: Secondary | ICD-10-CM | POA: Diagnosis not present

## 2024-03-23 DIAGNOSIS — L821 Other seborrheic keratosis: Secondary | ICD-10-CM | POA: Diagnosis not present

## 2024-03-23 DIAGNOSIS — L578 Other skin changes due to chronic exposure to nonionizing radiation: Secondary | ICD-10-CM | POA: Diagnosis not present

## 2024-03-23 DIAGNOSIS — L661 Lichen planopilaris, unspecified: Secondary | ICD-10-CM | POA: Diagnosis not present

## 2024-03-23 DIAGNOSIS — L570 Actinic keratosis: Secondary | ICD-10-CM | POA: Diagnosis not present

## 2024-03-29 DIAGNOSIS — Z630 Problems in relationship with spouse or partner: Secondary | ICD-10-CM | POA: Diagnosis not present

## 2024-03-29 DIAGNOSIS — F411 Generalized anxiety disorder: Secondary | ICD-10-CM | POA: Diagnosis not present

## 2024-05-31 ENCOUNTER — Other Ambulatory Visit: Payer: Self-pay | Admitting: Obstetrics & Gynecology

## 2024-05-31 ENCOUNTER — Ambulatory Visit
Admission: RE | Admit: 2024-05-31 | Discharge: 2024-05-31 | Disposition: A | Source: Ambulatory Visit | Attending: Obstetrics and Gynecology | Admitting: Obstetrics and Gynecology

## 2024-05-31 DIAGNOSIS — Z1231 Encounter for screening mammogram for malignant neoplasm of breast: Secondary | ICD-10-CM

## 2024-06-09 DIAGNOSIS — Z630 Problems in relationship with spouse or partner: Secondary | ICD-10-CM | POA: Diagnosis not present

## 2024-06-09 DIAGNOSIS — F411 Generalized anxiety disorder: Secondary | ICD-10-CM | POA: Diagnosis not present

## 2024-07-07 DIAGNOSIS — F411 Generalized anxiety disorder: Secondary | ICD-10-CM | POA: Diagnosis not present

## 2024-07-07 DIAGNOSIS — Z630 Problems in relationship with spouse or partner: Secondary | ICD-10-CM | POA: Diagnosis not present

## 2024-07-19 DIAGNOSIS — F411 Generalized anxiety disorder: Secondary | ICD-10-CM | POA: Diagnosis not present

## 2024-07-19 DIAGNOSIS — Z630 Problems in relationship with spouse or partner: Secondary | ICD-10-CM | POA: Diagnosis not present

## 2024-07-21 DIAGNOSIS — Z9109 Other allergy status, other than to drugs and biological substances: Secondary | ICD-10-CM | POA: Diagnosis not present
